# Patient Record
Sex: Female | Born: 1955 | Race: Black or African American | Hispanic: No | Smoking: Never smoker
Health system: Southern US, Community
[De-identification: ages and names within clinical notes are randomized; demographics above are authoritative.]

## PROBLEM LIST (undated history)

## (undated) HISTORY — PX: CHOLECYSTECTOMY: SHX55

## (undated) HISTORY — PX: ANTERIOR CERVICAL DISCECTOMY: SHX1160

---

## 1997-09-05 ENCOUNTER — Emergency Department (HOSPITAL_COMMUNITY): Admission: EM | Admit: 1997-09-05 | Discharge: 1997-09-05 | Payer: Self-pay | Admitting: Internal Medicine

## 1998-12-16 ENCOUNTER — Emergency Department (HOSPITAL_COMMUNITY): Admission: EM | Admit: 1998-12-16 | Discharge: 1998-12-17 | Payer: Self-pay | Admitting: *Deleted

## 1999-10-25 ENCOUNTER — Ambulatory Visit (HOSPITAL_COMMUNITY): Admission: RE | Admit: 1999-10-25 | Discharge: 1999-10-25 | Payer: Self-pay | Admitting: Family Medicine

## 1999-10-25 ENCOUNTER — Encounter: Payer: Self-pay | Admitting: Family Medicine

## 1999-11-12 ENCOUNTER — Encounter: Payer: Self-pay | Admitting: Family Medicine

## 1999-11-12 ENCOUNTER — Encounter: Admission: RE | Admit: 1999-11-12 | Discharge: 1999-11-12 | Payer: Self-pay | Admitting: Family Medicine

## 2000-01-03 ENCOUNTER — Inpatient Hospital Stay (HOSPITAL_COMMUNITY): Admission: RE | Admit: 2000-01-03 | Discharge: 2000-01-05 | Payer: Self-pay | Admitting: Neurosurgery

## 2000-02-08 ENCOUNTER — Encounter: Admission: RE | Admit: 2000-02-08 | Discharge: 2000-02-08 | Payer: Self-pay | Admitting: Neurosurgery

## 2000-03-20 ENCOUNTER — Encounter: Payer: Self-pay | Admitting: *Deleted

## 2000-03-20 ENCOUNTER — Encounter: Admission: RE | Admit: 2000-03-20 | Discharge: 2000-03-20 | Payer: Self-pay | Admitting: *Deleted

## 2000-04-11 ENCOUNTER — Encounter: Admission: RE | Admit: 2000-04-11 | Discharge: 2000-04-11 | Payer: Self-pay | Admitting: Neurosurgery

## 2001-03-21 ENCOUNTER — Encounter: Payer: Self-pay | Admitting: *Deleted

## 2001-03-21 ENCOUNTER — Encounter: Admission: RE | Admit: 2001-03-21 | Discharge: 2001-03-21 | Payer: Self-pay | Admitting: *Deleted

## 2001-09-20 ENCOUNTER — Other Ambulatory Visit: Admission: RE | Admit: 2001-09-20 | Discharge: 2001-09-20 | Payer: Self-pay | Admitting: *Deleted

## 2001-10-05 ENCOUNTER — Ambulatory Visit (HOSPITAL_COMMUNITY): Admission: RE | Admit: 2001-10-05 | Discharge: 2001-10-05 | Payer: Self-pay | Admitting: Gastroenterology

## 2002-01-07 ENCOUNTER — Encounter: Payer: Self-pay | Admitting: General Surgery

## 2002-01-08 ENCOUNTER — Encounter (INDEPENDENT_AMBULATORY_CARE_PROVIDER_SITE_OTHER): Payer: Self-pay | Admitting: Specialist

## 2002-01-08 ENCOUNTER — Ambulatory Visit (HOSPITAL_COMMUNITY): Admission: RE | Admit: 2002-01-08 | Discharge: 2002-01-08 | Payer: Self-pay | Admitting: General Surgery

## 2002-03-25 ENCOUNTER — Encounter: Admission: RE | Admit: 2002-03-25 | Discharge: 2002-03-25 | Payer: Self-pay | Admitting: *Deleted

## 2002-03-25 ENCOUNTER — Encounter: Payer: Self-pay | Admitting: *Deleted

## 2002-12-30 ENCOUNTER — Other Ambulatory Visit: Admission: RE | Admit: 2002-12-30 | Discharge: 2002-12-30 | Payer: Self-pay | Admitting: Family Medicine

## 2003-01-06 ENCOUNTER — Ambulatory Visit (HOSPITAL_COMMUNITY): Admission: RE | Admit: 2003-01-06 | Discharge: 2003-01-06 | Payer: Self-pay | Admitting: Family Medicine

## 2004-03-14 ENCOUNTER — Other Ambulatory Visit: Admission: RE | Admit: 2004-03-14 | Discharge: 2004-03-14 | Payer: Self-pay | Admitting: Family Medicine

## 2004-03-15 ENCOUNTER — Ambulatory Visit (HOSPITAL_COMMUNITY): Admission: RE | Admit: 2004-03-15 | Discharge: 2004-03-15 | Payer: Self-pay | Admitting: Family Medicine

## 2004-03-23 ENCOUNTER — Encounter (HOSPITAL_COMMUNITY): Admission: RE | Admit: 2004-03-23 | Discharge: 2004-06-21 | Payer: Self-pay | Admitting: Family Medicine

## 2005-01-17 ENCOUNTER — Emergency Department (HOSPITAL_COMMUNITY): Admission: EM | Admit: 2005-01-17 | Discharge: 2005-01-17 | Payer: Self-pay | Admitting: Emergency Medicine

## 2007-02-21 ENCOUNTER — Other Ambulatory Visit: Admission: RE | Admit: 2007-02-21 | Discharge: 2007-02-21 | Payer: Self-pay | Admitting: Family Medicine

## 2009-01-21 ENCOUNTER — Other Ambulatory Visit: Admission: RE | Admit: 2009-01-21 | Discharge: 2009-01-21 | Payer: Self-pay | Admitting: Obstetrics and Gynecology

## 2009-02-09 ENCOUNTER — Encounter: Admission: RE | Admit: 2009-02-09 | Discharge: 2009-02-09 | Payer: Self-pay | Admitting: Obstetrics and Gynecology

## 2010-02-06 ENCOUNTER — Encounter: Payer: Self-pay | Admitting: Family Medicine

## 2010-02-07 ENCOUNTER — Encounter: Payer: Self-pay | Admitting: Endocrinology

## 2010-02-10 ENCOUNTER — Encounter
Admission: RE | Admit: 2010-02-10 | Discharge: 2010-02-10 | Payer: Self-pay | Source: Home / Self Care | Attending: Family Medicine | Admitting: Family Medicine

## 2010-06-04 NOTE — Op Note (Signed)
[place]  Patient:    Dorothy Davis, Dorothy Davis                   MRN: [id number] Proc. Date: 01/03/00 Adm. Date:  [id number] Attending:  Tressie Stalker D                           Operative Report  PREOPERATIVE DIAGNOSES:  C5-6 herniated nucleus pulposus, degenerative disk disease, spinal stenosis, cervical radiculopathy.  POSTOPERATIVE DIAGNOSES:  C5-6 herniated nucleus pulposus, degenerative disk disease, spinal stenosis, cervical radiculopathy.  PROCEDURE:  C5-6 extensive anterior cervical diskectomy, interbody iliac crest allograft arthrodesis, anterior cervical plating C5 and C6 using Codman titanium plate and screws.  SURGEON:  Cristi Loron, M.D.  ASSISTANT:  Stefani Dama, M.D.  ANESTHESIA:  General endotracheal.  ESTIMATED BLOOD LOSS:  100 cc.  SPECIMENS:  None.  DRAINS:  None.  COMPLICATIONS:  None.  BRIEF HISTORY:  The patient is a 55 year old black female who suffered from neck and left arm pain.  She failed medical management and was worked up as an outpatient with a cervical MRI that demonstrated a herniated nucleus pulposus at C5-6 on the left.  The patients signs and symptoms and physical exam were consistent with a left C6 radiculopathy.  She therefore weighed the risks, benefits, and alternatives of surgery and decided to proceed with a C5-6 anterior cervical diskectomy and fusion and plating.  DESCRIPTION OF PROCEDURE:  The patient was brought to the operating room by the anesthesia team.  General endotracheal anesthesia was induced.  The patient remained in a supine position.  A roll was placed under her shoulder to place her neck in slight extension.  Her anterior cervical region was then prepared with Betadine scrub and Betadine solution and sterile drapes were applied.  I then injected the area to be incised with Marcaine with epinephrine solution and then used a scalpel to make a transverse incision  in the patients left neck.  I used Metzenbaum scissors to dissect down to the platysma muscle.  I divided it along the direction of the skin incision and then dissected with the scissors medial to the sternocleidomastoid muscle, jugular vein, and carotid artery.  I carefully dissected down toward the anterior cervical spine, identified the esophagus, and carefully retracted it medially.  I cleared the soft tissue from the anterior cervical spine with Kitner swabs.  I inserted a bent spinal needle into the exposed interspace and obtained an intraoperative radiograph, which demonstrated the needle was in C5-6.  I then used electrocautery to detach the medial border of the longus colli muscle bilaterally from C5-6 interspace and inserted the Caspar self-retaining retractor for exposure.  I then incised the C5-6 intervertebral disk with a 15 blade scalpel and performed a partial diskectomy with the pituitary forceps and the Carlin curettes.  I then used the high-speed drill to decorticate the vertebral end plates at C5 and C6.  I drilled away the remainder of the intervertebral disk at C5-6.  I thinned out the posterior longitudinal ligament with the drill and incised it with the arachnoid knife. I removed the ligament with the Kerrison punch, undercutting the vertebral end plates at C5 and C6, decompressing the thecal sac.  On the left side, there was a herniated disk, which I removed with the pituitary forceps and the Kerrison punches, decompressing the C6 nerve root.  I performed a foraminotomy about the bilateral C6 nerve root.  At this point, I had a good decompression of the thecal sac and bilateral C6 nerve root.  I now turned my attention to the arthrodesis.  I obtained an iliac crest tricortical allograft bone graft and fashioned it to these approximate dimensions:  7 mm in height, 1 cm in depth.  I inserted it in the distracted C5-6 interspace (I forgot to mention, I placed  distraction screws in at C5-6 and distracted the interspace).  I then removed the distraction screws.  There was a good, snug fit of the bone graft at C5-6.  Having completed the arthrodesis, I now turned my attention to the anterior spinal instrumentation.  I obtained the appropriate length Codman anterior cervical plate, laid it along the anterior aspect of C5-6, drilled two holes at C5, two holes at C6, tapped these holes, and secured the plate to the vertebral bodies with 12 mm screws.  I obtained the intraoperative radiograph demonstrating good position of the plate, screws, and interbody graft.  I then secured the screws to the plate with the cam tightener.  I then copiously irrigated the wound with bacitracin solution and removed the solution and then achieved stringent hemostasis.  I then removed the Caspar self-retaining retractor, inspected the esophagus for any damage, there was none, and then reapproximated the patients platysma muscle with interrupted 0 Vicryl suture, the subcutaneous tissue with interrupted 3-0 Vicryl suture, and skin with Steri-Strips and benzoin.  The wound was then coated with bacitracin ointment and a sterile dressing was applied and the drapes were removed, and the patient was subsequently extubated by the anesthesia team and transported to the postanesthesia care unit in stable condition.  All sponge, instrument, and needle counts were correct at the end of this case. DD:  01/03/00 TD:  01/04/00 Job: [id number]

## 2010-06-04 NOTE — Op Note (Signed)
Dorothy Davis, Dorothy Davis                      ACCOUNT NO.:  [id number]   MEDICAL RECORD NO.:  [id number]                   PATIENT TYPE:  AMB   LOCATION:  DAY                                  FACILITY:  [facility]   PHYSICIAN:  Adolph Pollack, M.D.            DATE OF BIRTH:  1955/07/03   DATE OF PROCEDURE:  01/08/2002  DATE OF DISCHARGE:                                 OPERATIVE REPORT   PREOPERATIVE DIAGNOSIS:  Internal and external hemorrhoids.  o   POSTOPERATIVE DIAGNOSIS:  Internal and external hemorrhoids.   PROCEDURE:  Internal and external hemorrhoidectomies with anoscopy.   SURGEON:  Adolph Pollack, M.D.   ANESTHESIA:  General.   INDICATIONS:  The patient is a 55 year old female who had been bothered by  pain and bleeding from hemorrhoids for many years. We tried rubber band  ligation in the office but she continues to have symptoms. She now presents  for elective hemorrhoidectomy. The procedure and the risks were explained to  her preoperatively.   DESCRIPTION OF PROCEDURE:  She was brought to the operating room and placed  supine on the operating table and a general anesthetic was administered. She  was then placed in the lithotomy position. The perianal area was sterilely  prepped and draped.   A digital rectal exam was done and no anal masses were noted. The anoscope  was inserted and a left lateral hemorrhoid with internal and external was  noted. The area for the incision was marked with a Bovie. The vascular  pedicle was then ligated with a 2-0 chromic suture and 0.0.5% Marcaine with  epinephrine was infiltrated in the subdermal tissues.   The hemorrhoid was excised sharply on both its internal and external  components, with care taken not to injure the underlying external or  internal sphincter muscles. It was handed off the field and marked left  lateral hemorrhoid. The bleeding was controlled with cautery. Then the  mucosa and the skin were  reapproximated with a running locking 3-0 chromic  suture.   Next a right posterolateral hemorrhoid was approached. Again there were  internal and external components like there was to the left lateral  hemorrhoid. The area of excision was marked with cautery and then the  vascular pedicle was ligated with 3-0 chromic suture. Local anesthetic was  infiltrated in the subdermal region and the hemorrhoid was then excised  sharply, again, taking care not to injure external or internal sphincter  muscle. The hemorrhoid was labeled right posterior.  The bleeding was  controlled with cautery. The mucosa and skin were closed with running  locking 3-0 chromic suture.   Posteriorly between these other two, there was another accessory  hemorrhoidal cushion. I ligated the vascular pedicle, anesthetized it,  marked the area of incision with the Bovie and excised it sharply and closed  the defect with running locking 3-0 chromic suture. There was a small,  moderate sized internal  hemorrhoid only in the right anterolateral position,  and I just ligated this with a chromic suture.   I inspected the area and noted that hemostasis was adequate. She did not  appear to be too tight with respect to anal stenosis. I inserted a piece of  Gelfoam into the anus and covered it with a bulky dressing.   The patient tolerated the procedure well without any apparent complications.  She was extubated and transferred to the recovery room in satisfactory  condition. I have given her detailed postoperative instructions as well as  Tylox for pain and she will follow up with me in three weeks in the office.                                               Adolph Pollack, M.D.    Kari Baars  D:  01/08/2002  T:  01/09/2002  Job:  [id number]   cc:   Anselmo Rod, M.D.  [address]  [place]  [place] [address]  Fax: [phone]   Elana Alm. Nicholos Johns, M.D.  [address]  [place]  [place] [address]   Fax: [phone]

## 2010-06-04 NOTE — Discharge Summary (Signed)
[place]. [facility]  Patient:    Dorothy Davis, Dorothy Davis                   MRN: [id number] Adm. Date:  [id number] Disc. Date: [id number] Attending:  Tressie Stalker D                           Discharge Summary  For full details of this admission please refer to typed history and physical.  HISTORY OF PRESENT ILLNESS:  The patient is a 55 year old black female who suffers from neck and left arm.  She failed medical management and was worked up as an outpatient with a cervical MRI that demonstrated herniated nucleus pulposus C5-6 on the left.  The patients signs and symptoms and physical exam were consistent with a left C6 radiculopathy.  She therefore weighed the risks, benefits, alternatives of surgery and decided to proceed with a C5-6 anterior cervical diskectomy, fusion, and plating.  PAST MEDICAL HISTORY/PAST SURGICAL HISTORY/MEDICATIONS PRIOR TO ADMISSION/DRUG ALLERGIES/FAMILY AND MEDICAL HISTORY/SOCIAL HISTORY/ADMISSION PHYSICAL EXAMINATION/IMAGING STUDIES/ASSESSMENT AND PLAN/ETC:  Please refer to typed history and physical.  HOSPITAL COURSE:  I performed a C5-6 extensive anterior cervical diskectomy, interbody iliac crest allograft arthrodesis, anterior cervical plating C5-6 using Codman titanium plate and screws on January 03, 2000.  The surgery went well without complications (for full details of this operation please refer to typed operative note).  POSTOPERATIVE COURSE:  The patients postoperative course was remarkable only for a fever to 100.8 on postop day #1.  She was therefore observed to postop day #2 and her maximum temperature was 100.1, there were no signs of infection, wound was healing well, she was feeling well, she was eating well, ambulating well, and she had normal motor strength and she was requesting discharge home.  I therefore discharged her home on January 05, 2000.  DISCHARGE MEDICATIONS: 1. Demerol 50 mg #60 one to two p.o.  q.4h. p.r.n. for pain, no refills. 2. Valium 5 mg #40 one p.o. q.6h. p.r.n. for muscle spasms, one refill.  FINAL DIAGNOSES: 1. C5-6 herniated nucleus pulposus. 2. Degenerative disk disease. 3. Spinal stenosis. 4. Cervical radiculopathy.  PROCEDURE PERFORMED:  C5-6 extensive anterior cervical diskectomy, interbody iliac crest allograft arthrodesis, anterior cervical plating of C5-6 using Codman titanium plate and screws. DD:  01/05/00 TD:  01/06/00 Job: [id number]

## 2010-06-04 NOTE — Op Note (Signed)
Dorothy Davis, Dorothy Davis                      ACCOUNT NO.:  [id number]   MEDICAL RECORD NO.:  [id number]                   PATIENT TYPE:  AMB   LOCATION:  ENDO                                 FACILITY:  [facility]   PHYSICIAN:  Charna Elizabeth, M.D.                   DATE OF BIRTH:  Apr 09, 1955   DATE OF PROCEDURE:  10/05/2001  DATE OF DISCHARGE:                                 OPERATIVE REPORT   PROCEDURE:  Colonoscopy.   ENDOSCOPIST:  Charna Elizabeth, M.D.   INSTRUMENT USED:  Olympus video colonoscope.   INDICATION FOR PROCEDURE:  A 55 year old African-American female with a one-  year history of rectal bleeding and a family history of breast cancer in her  sister.  There is no known history of colon cancer.  The patient has not had  a previous colonoscopy.   PREPROCEDURE PREPARATION:  Informed consent was procured from the patient.  The patient was fasted for eight hours prior to the procedure and prepped  with a bottle of magnesium citrate and a gallon of NuLytely the night prior  to the procedure.   PREPROCEDURE PHYSICAL:  VITAL SIGNS:  The patient had stable vital signs.  NECK:  Supple.  CHEST:  Clear to auscultation.  S1, S2 regular.  ABDOMEN:  Soft with normal bowel sounds.   DESCRIPTION OF PROCEDURE:  The patient was placed in the left lateral  decubitus position and sedated with 70 mg of Demerol and 8 mg of Versed  intravenously.  Once the patient was adequately sedate and maintained on low-  flow oxygen and continuous cardiac monitoring, the Olympus video colonoscope  was advanced from the rectum to the cecum without difficulty.  The patient  had large external hemorrhoids, which were bleeding.  No other abnormalities  were noted.  The appendiceal orifice and the ileocecal valve were clearly  visualized and photographed.   IMPRESSION:  Normal colonoscopy up to the cecum except for large bleeding  prolapsed internal hemorrhoids.   RECOMMENDATIONS:  1. A surgical evaluation  has been set up for the patient with Adolph Pollack, M.D., on 10/09/01.  2. A high-fiber diet and liberal fluid intake have been advocated.  3.     Repeat colorectal cancer screening is recommended in the next 10 years     unless the patient develops any abnormal symptoms in the interim.  4. Outpatient follow-up in the next two weeks or earlier if need be.                                               Charna Elizabeth, M.D.    JM/MEDQ  D:  10/05/2001  T:  10/08/2001  Job:  [id number]   cc:   Molly Maduro A. Nicholos Johns, M.D.   Tawanna Cooler  Laurie Panda, M.D.  Fax: [phone]

## 2010-06-04 NOTE — H&P (Signed)
[place]. [facility]  Patient:    Dorothy Davis, Dorothy Davis                   MRN: [id number] Proc. Date: 01/03/00 Adm. Date:  [id number] Attending:  Tressie Stalker D                         History and Physical  CHIEF COMPLAINT: Neck pain and left arm pain.  HISTORY OF PRESENT ILLNESS: The patient is a 55 year old black female who began having neck and left arm pain in September 2001.  She was treated with medications and she failed medical management.  She was worked up with a cervical MRI that demonstrates a herniated disk at C5-6.  The patient S1 signs and symptoms and physical examination were consistent with a C6 radiculopathy and she therefore weighed the risks and benefits and alternatives of surgery and decided to proceed with anterior cervical diskectomy with fusion and plating.  PAST MEDICAL HISTORY: Remote history of cholecystitis.  PAST SURGICAL HISTORY: Cholecystectomy.  MEDICATIONS: Over-the-counter medications.  ALLERGIES:  1. PENICILLIN.  2. SULFA.  3. CODEINE.  4. ADVIL.  FAMILY HISTORY: The patients mother died of complications from diabetes mellitus at the age of 74.  The patients father died at the age of 97 with throat cancer, myocardial infarction, and alcohol abuse.  SOCIAL HISTORY: The patient is married and has two children.  [place]  She is employed at [employer] as a Occupational psychologist.  She has smoked 1-1/2 packs of cigarettes per day x approximately 26 years - I have highly advised her to quit.  She denies ethanol or drug use.  REVIEW OF SYSTEMS: Negative except as above.  PHYSICAL EXAMINATION:  GENERAL: The patient is a pleasant, moderately obese 55 year old black female, complaining of neck and left arm pain.  HEENT: Normal examination.  NECK: Supple.  There are no masses, deformities, tracheal deviation, jugular venous distention, or carotid bruits.  She has mildly  diminished cervical range of motion.  Spurling test is positive on the left, negative on the right.  Lhermittes sign was not present.  The thorax is symmetric.  LUNGS: Clear to auscultation.  HEART: Regular rate and rhythm.  ABDOMEN: Obese, soft, nontender.  EXTREMITIES: No obvious deformities.  BACK: No point tenderness or deformities.  Obese.  Straight leg raising and Faber testing negative bilaterally.  NEUROLOGIC: The patient is alert and oriented x 3.  Cranial nerves 2-12 are grossly intact bilaterally.  Vision and hearing are grossly normal bilaterally.  Motor strength is 5/5 and the bilateral deltoids, biceps, triceps, hand grip, wrist extensor, interosseous, psoas, quadriceps, gastrocnemius, and extensor hallucis longus.  Cerebellar examination is intact to rapid alternating movements of the upper extremities bilaterally.  Sensory examination is normal to light touch in all tested dermatomes bilaterally. Deep tendon reflexes are 2/4 in the bilateral biceps, triceps, brachial radialis, and 1/4 in the bilateral quadriceps, gastrocnemius.  She has bilateral flexor and plantar reflexes.  No ankle clonus.  IMAGING STUDIES: The patient had a cervical MRI performed at [facility] at November 12, 1999 that demonstrates a straight cervical spine and left-sided herniated nucleus pulposus at C5-6.  ASSESSMENT/PLAN: C5-6 herniated nucleus pulposus with spinal stenosis, degenerative disk disease, and cervical radiculopathy.  I have reviewed the patients MRI scan with her and pointed out the abnormalities.  Her signs and symptoms and physical examination are consistent with a C6 radiculopathy and she  has failed medical management.  I therefore discussed surgical options including a C5-6 anterior cervical diskectomy with fusion and plating.  I have shown her surgical models and discussed the risks of surgery extensively.  The patient has weighed the risks and benefits and alternatives of surgery  and would like to proceed with C5-6 anterior cervical diskectomy with fusion and plating on January 03, 2000. DD:  01/03/00 TD:  01/04/00 Job: [id number]

## 2011-01-25 ENCOUNTER — Other Ambulatory Visit: Payer: Self-pay | Admitting: Family Medicine

## 2011-01-25 DIAGNOSIS — Z1231 Encounter for screening mammogram for malignant neoplasm of breast: Secondary | ICD-10-CM

## 2011-02-16 ENCOUNTER — Ambulatory Visit
Admission: RE | Admit: 2011-02-16 | Discharge: 2011-02-16 | Disposition: A | Discharge status: Home / Self Care | Payer: BC Managed Care – PPO | Source: Ambulatory Visit | Attending: Family Medicine | Admitting: Family Medicine

## 2011-02-16 DIAGNOSIS — Z1231 Encounter for screening mammogram for malignant neoplasm of breast: Secondary | ICD-10-CM

## 2012-01-30 ENCOUNTER — Other Ambulatory Visit: Payer: Self-pay | Admitting: Family Medicine

## 2012-01-30 DIAGNOSIS — Z1231 Encounter for screening mammogram for malignant neoplasm of breast: Secondary | ICD-10-CM

## 2012-02-20 ENCOUNTER — Ambulatory Visit
Admission: RE | Admit: 2012-02-20 | Discharge: 2012-02-20 | Disposition: A | Discharge status: Home / Self Care | Payer: BC Managed Care – PPO | Source: Ambulatory Visit | Attending: Family Medicine | Admitting: Family Medicine

## 2012-02-20 DIAGNOSIS — Z1231 Encounter for screening mammogram for malignant neoplasm of breast: Secondary | ICD-10-CM

## 2013-03-12 ENCOUNTER — Other Ambulatory Visit: Payer: Self-pay

## 2013-03-12 DIAGNOSIS — Z1231 Encounter for screening mammogram for malignant neoplasm of breast: Secondary | ICD-10-CM

## 2013-03-15 ENCOUNTER — Ambulatory Visit: Payer: BC Managed Care – PPO

## 2013-03-29 ENCOUNTER — Ambulatory Visit
Admission: RE | Admit: 2013-03-29 | Discharge: 2013-03-29 | Disposition: A | Discharge status: Home / Self Care | Payer: BC Managed Care – PPO | Source: Ambulatory Visit

## 2013-03-29 DIAGNOSIS — Z1231 Encounter for screening mammogram for malignant neoplasm of breast: Secondary | ICD-10-CM

## 2014-03-18 ENCOUNTER — Other Ambulatory Visit: Payer: Self-pay

## 2014-03-18 DIAGNOSIS — Z1231 Encounter for screening mammogram for malignant neoplasm of breast: Secondary | ICD-10-CM

## 2014-04-01 ENCOUNTER — Ambulatory Visit
Admission: RE | Admit: 2014-04-01 | Discharge: 2014-04-01 | Disposition: A | Discharge status: Home / Self Care | Payer: BLUE CROSS/BLUE SHIELD | Source: Ambulatory Visit

## 2014-04-01 DIAGNOSIS — Z1231 Encounter for screening mammogram for malignant neoplasm of breast: Secondary | ICD-10-CM

## 2020-05-02 ENCOUNTER — Other Ambulatory Visit: Payer: Self-pay

## 2020-05-02 ENCOUNTER — Encounter: Payer: Self-pay | Admitting: Emergency Medicine

## 2020-05-02 ENCOUNTER — Emergency Department (INDEPENDENT_AMBULATORY_CARE_PROVIDER_SITE_OTHER)
Admission: EM | Admit: 2020-05-02 | Discharge: 2020-05-02 | Disposition: A | Discharge status: Home / Self Care | Payer: 59 | Source: Home / Self Care

## 2020-05-02 DIAGNOSIS — R21 Rash and other nonspecific skin eruption: Secondary | ICD-10-CM

## 2020-05-02 DIAGNOSIS — B86 Scabies: Secondary | ICD-10-CM | POA: Diagnosis not present

## 2020-05-02 MED ORDER — PERMETHRIN 5 % EX CREA: TOPICAL_CREAM | CUTANEOUS | 0 refills | Status: DC

## 2020-05-02 NOTE — ED Provider Notes (Signed)
[facility]   [id number] 05/02/20 Arrival Time: 1353  CC: RASH  SUBJECTIVE:  Dorothy Davis is a 65 y.o. female who presents with a skin complaint that began 2 weeks ago. Reports scabies exposure to her great grandson. Denies precipitating event or trauma. Denies changes in soaps, detergents, close contacts with similar rash, known trigger or environmental trigger, allergy. Denies medications change or starting a new medication recently.  Localizes the rash to hands, arms, neck and back. Describes it as red, raised, itchy. Has tried neem oil and benadryl without relief. There are no aggravating or alleviating factors. Denies similar symptoms in the past. Denies fever, chills, nausea, vomiting, swelling, discharge, oral lesions, SOB, chest pain, abdominal pain, changes in bowel or bladder function.    ROS: As per HPI.  All other pertinent ROS negative.     History reviewed. No pertinent past medical history. Past Surgical History:  Procedure Laterality Date  . ANTERIOR CERVICAL DISCECTOMY    . CHOLECYSTECTOMY     Allergies  Allergen Reactions  . Codeine Hives  . Penicillins Hives  . Sulfa Antibiotics   . Aspirin Rash   No current facility-administered medications on file prior to encounter.   No current outpatient medications on file prior to encounter.   Social History   Socioeconomic History  . Marital status: Divorced    Spouse name: Not on file  . Number of children: Not on file  . Years of education: Not on file  . Highest education level: Not on file  Occupational History  . Not on file  Tobacco Use  . Smoking status: Never Smoker  . Smokeless tobacco: Never Used  Substance and Sexual Activity  . Alcohol use: Not Currently  . Drug use: Never  . Sexual activity: Not on file  Other Topics Concern  . Not on file  Social History Narrative  . Not on file   Social Determinants of Health   Financial Resource Strain: Not on file  Food Insecurity: Not  on file  Transportation Needs: Not on file  Physical Activity: Not on file  Stress: Not on file  Social Connections: Not on file  Intimate Partner Violence: Not on file   Family History  Problem Relation Age of Onset  . Diabetes Mother   . Diabetes Father   . Throat cancer Father     OBJECTIVE: Vitals:   05/02/20 1408 05/02/20 1410  BP: (!) 147/93   Pulse: 88   Resp: 16   Temp: 98 F (36.7 C)   TempSrc: Oral   SpO2: 100%   Weight:  190 lb (86.2 kg)  Height:  5' 4.5" (1.638 m)    General appearance: alert; no distress Head: NCAT Lungs: clear to auscultation bilaterally Heart: regular rate and rhythm.  Radial pulse 2+ bilaterally Extremities: no edema Skin: warm and dry; erythematous maculopapular rash to bilateral wrists, arms, bag, between fingers, consistent with scabies Psychological: alert and cooperative; normal mood and affect  ASSESSMENT & PLAN:  1. Scabies   2. Rash     Meds ordered this encounter  Medications  . permethrin (ELIMITE) 5 % cream    Sig: Apply to affected area once    Dispense:  60 g    Refill:  0    Order Specific Question:   Supervising Provider    Answer:   Merrilee Jansky [[id number]]   Permethrin prescribed Apply to skin from the neck down Sleep in the cream for 8-12 hours, then shower Avoid  hot showers/ baths Moisturize skin daily  Follow up with PCP if symptoms persists Return or go to the ER if you have any new or worsening symptoms such as fever, chills, nausea, vomiting, redness, swelling, discharge, if symptoms do not improve with medications  Reviewed expectations re: course of current medical issues. Questions answered. Outlined signs and symptoms indicating need for more acute intervention. Patient verbalized understanding. After Visit Summary given.   Moshe Cipro, NP 05/03/20 [phone]

## 2020-05-02 NOTE — Discharge Instructions (Addendum)
I have sent in permethrin cream. Apply liberally to the skin from the neck to the soles of the feet. Sleep with the cream on the skin, then wash off in the morning.  If rash returns, may repeat application in 2 weeks.  Follow up with this office or with primary care if symptoms are persisting.  Follow up in the ER for high fever, trouble swallowing, trouble breathing, other concerning symptoms.

## 2020-05-02 NOTE — ED Triage Notes (Signed)
Pt's great grandson was diagnosed w/ scabies w/in the last few weeks  Pt presents w/ 2 weeks of itching to wrists & back - red areas noted  No COVID vaccine

## 2020-05-08 ENCOUNTER — Other Ambulatory Visit: Payer: Self-pay | Admitting: Emergency Medicine

## 2020-05-08 ENCOUNTER — Telehealth: Payer: Self-pay | Admitting: Emergency Medicine

## 2020-05-08 MED ORDER — PERMETHRIN 5 % EX CREA: TOPICAL_CREAM | CUTANEOUS | 0 refills | Status: DC

## 2020-05-08 NOTE — Telephone Encounter (Signed)
permethrin (ELIMITE) 5 % cream     Sig: Apply to affected area once    Dispense:  60 g    Refill:  0

## 2020-05-08 NOTE — Telephone Encounter (Signed)
Voice mail from Dorothy Davis requesting a refill of permethrin cream. Pt was treated on 06/01/20 for scabies. Message sent to Dr Cathren Harsh

## 2020-05-08 NOTE — Telephone Encounter (Signed)
Return call to Dorothy Davis to confirm script had been sent to pharmacy. Pt confirmed she had already received text from pharmacy & will pick up in am

## 2020-05-30 ENCOUNTER — Other Ambulatory Visit: Payer: Self-pay

## 2020-05-30 ENCOUNTER — Encounter: Payer: Self-pay | Admitting: Emergency Medicine

## 2020-05-30 ENCOUNTER — Emergency Department
Admission: EM | Admit: 2020-05-30 | Discharge: 2020-05-30 | Disposition: A | Discharge status: Home / Self Care | Payer: 59 | Source: Home / Self Care

## 2020-05-30 DIAGNOSIS — L282 Other prurigo: Secondary | ICD-10-CM | POA: Diagnosis not present

## 2020-05-30 DIAGNOSIS — Z207 Contact with and (suspected) exposure to pediculosis, acariasis and other infestations: Secondary | ICD-10-CM

## 2020-05-30 DIAGNOSIS — Z2089 Contact with and (suspected) exposure to other communicable diseases: Secondary | ICD-10-CM

## 2020-05-30 MED ORDER — PERMETHRIN 5 % EX CREA: TOPICAL_CREAM | CUTANEOUS | 1 refills | Status: AC

## 2020-05-30 NOTE — ED Triage Notes (Signed)
Recheck for scabies - treated 05/02/20 Pt states she has noted red marks on legs  Pt has treated home - scabies was contained to one bedroom

## 2020-05-30 NOTE — ED Provider Notes (Signed)
[facility]    CSN: [id number] Arrival date & time: 05/30/20  1052      History   Chief Complaint Chief Complaint  Patient presents with  . Follow-up    HPI Dorothy Davis is a 65 y.o. female.   HPI Pleasant 65 year old female presents with follow-up for scabies.  Patient reports was initially treated on 05/02/2020 here with permethrin (Elimite 5%), patient requests another prescription for permethrin as rash is not completely resolved reports pruritic rash with scabies of left torso, bilateral breast, and lower back.  She was sent second prescription of permethrin (Elimite 5%) on 05/30/2020.  She reports her insurance company required her to have reevaluation in order to pay for medication again.  History reviewed. No pertinent past medical history.  There are no problems to display for this patient.   Past Surgical History:  Procedure Laterality Date  . ANTERIOR CERVICAL DISCECTOMY    . CHOLECYSTECTOMY      OB History   No obstetric history on file.      Home Medications    Prior to Admission medications   Medication Sig Start Date End Date Taking? Authorizing Provider  permethrin (ELIMITE) 5 % cream Apply to affected area once, leave on for 8 to 14 hours then rinse completely, may repeat in 7 to 10 days if live mites are still present. 05/30/20  Yes Trevor Iha, FNP    Family History Family History  Problem Relation Age of Onset  . Diabetes Mother   . Diabetes Father   . Throat cancer Father     Social History Social History   Tobacco Use  . Smoking status: Never Smoker  . Smokeless tobacco: Never Used  Substance Use Topics  . Alcohol use: Not Currently  . Drug use: Never     Allergies   Codeine, Penicillins, Sulfa antibiotics, and Aspirin   Review of Systems Review of Systems  Constitutional: Negative.   HENT: Negative.   Eyes: Negative.   Respiratory: Negative.   Cardiovascular: Negative.   Gastrointestinal: Negative.    Genitourinary: Negative.   Musculoskeletal: Negative.   Skin: Positive for rash.  Neurological: Negative.      Physical Exam Triage Vital Signs ED Triage Vitals  Enc Vitals Group     BP 05/30/20 1107 114/73     Pulse Rate 05/30/20 1107 68     Resp 05/30/20 1107 15     Temp 05/30/20 1107 98.4 F (36.9 C)     Temp src --      SpO2 05/30/20 1107 100 %     Weight --      Height --      Head Circumference --      Peak Flow --      Pain Score 05/30/20 1108 0     Pain Loc --      Pain Edu? --      Excl. in GC? --    No data found.  Updated Vital Signs BP 114/73 (BP Location: Right Arm)   Pulse 68   Temp 98.4 F (36.9 C)   Resp 15   LMP 03/20/2014   SpO2 100%   Physical Exam Vitals and nursing note reviewed.  Constitutional:      General: She is not in acute distress.    Appearance: Normal appearance. She is not ill-appearing.  HENT:     Head: Normocephalic and atraumatic.     Mouth/Throat:     Mouth: Mucous membranes are moist.  Pharynx: Oropharynx is clear.  Eyes:     Extraocular Movements: Extraocular movements intact.     Conjunctiva/sclera: Conjunctivae normal.     Pupils: Pupils are equal, round, and reactive to light.  Cardiovascular:     Rate and Rhythm: Normal rate and regular rhythm.     Pulses: Normal pulses.     Heart sounds: Normal heart sounds.  Pulmonary:     Effort: Pulmonary effort is normal. No respiratory distress.     Breath sounds: Normal breath sounds. No wheezing, rhonchi or rales.  Musculoskeletal:     Cervical back: Normal range of motion and neck supple. No tenderness.  Lymphadenopathy:     Cervical: No cervical adenopathy.  Skin:    Comments: Left superior torso/lower back: Diffuse scattered maculopapular eruption noted, pruritic in nature  Neurological:     General: No focal deficit present.     Mental Status: She is alert and oriented to person, place, and time.  Psychiatric:        Mood and Affect: Mood normal.         Behavior: Behavior normal.      UC Treatments / Results  Labs (all labs ordered are listed, but only abnormal results are displayed) Labs Reviewed - No data to display  EKG   Radiology No results found.  Procedures Procedures (including critical care time)  Medications Ordered in UC Medications - No data to display  Initial Impression / Assessment and Plan / UC Course  I have reviewed the triage vital signs and the nursing notes.  Pertinent labs & imaging results that were available during my care of the patient were reviewed by me and considered in my medical decision making (see chart for details).     MDM: 1.  Scabies, 2.  Pruritic rash.  Patient discharged home, hemodynamically stable. Final Clinical Impressions(s) / UC Diagnoses   Final diagnoses:  Scabies exposure  Pruritic rash     Discharge Instructions     Advised/instructed patient to apply Elimite 5% to affected area leave cream on for 8 to 12 hours then shower completely removing cream, advised may repeat in 7-10 days, if live mites are present.    ED Prescriptions    Medication Sig Dispense Auth. Provider   permethrin (ELIMITE) 5 % cream Apply to affected area once, leave on for 8 to 14 hours then rinse completely, may repeat in 7 to 10 days if live mites are still present. 60 g Trevor Iha, FNP     PDMP not reviewed this encounter.   Trevor Iha, FNP 05/30/20 1206

## 2020-05-30 NOTE — Discharge Instructions (Addendum)
Advised/instructed patient to apply Elimite 5% to affected area leave cream on for 8 to 12 hours then shower completely removing cream, advised may repeat in 7-10 days, if live mites are present.

## 2020-10-30 ENCOUNTER — Other Ambulatory Visit: Payer: Self-pay | Admitting: Family

## 2020-10-30 DIAGNOSIS — Z1231 Encounter for screening mammogram for malignant neoplasm of breast: Secondary | ICD-10-CM

## 2020-11-04 ENCOUNTER — Other Ambulatory Visit: Payer: Self-pay

## 2020-11-04 ENCOUNTER — Ambulatory Visit
Admission: RE | Admit: 2020-11-04 | Discharge: 2020-11-04 | Disposition: A | Discharge status: Home / Self Care | Payer: Medicare Other | Source: Ambulatory Visit | Attending: Family | Admitting: Family

## 2020-11-04 DIAGNOSIS — Z1231 Encounter for screening mammogram for malignant neoplasm of breast: Secondary | ICD-10-CM

## 2021-02-03 ENCOUNTER — Other Ambulatory Visit: Payer: Self-pay | Admitting: Student

## 2021-02-03 DIAGNOSIS — E2839 Other primary ovarian failure: Secondary | ICD-10-CM

## 2021-06-22 ENCOUNTER — Ambulatory Visit
Admission: RE | Admit: 2021-06-22 | Discharge: 2021-06-22 | Disposition: A | Discharge status: Home / Self Care | Payer: Medicare Other | Source: Ambulatory Visit | Attending: Student | Admitting: Student

## 2021-06-22 DIAGNOSIS — E2839 Other primary ovarian failure: Secondary | ICD-10-CM

## 2021-10-04 ENCOUNTER — Other Ambulatory Visit: Payer: Self-pay | Admitting: Family

## 2021-10-04 DIAGNOSIS — Z1231 Encounter for screening mammogram for malignant neoplasm of breast: Secondary | ICD-10-CM

## 2021-11-05 ENCOUNTER — Ambulatory Visit
Admission: RE | Admit: 2021-11-05 | Discharge: 2021-11-05 | Disposition: A | Discharge status: Home / Self Care | Payer: Medicare Other | Source: Ambulatory Visit | Attending: Family | Admitting: Family

## 2021-11-05 DIAGNOSIS — Z1231 Encounter for screening mammogram for malignant neoplasm of breast: Secondary | ICD-10-CM

## 2022-10-05 ENCOUNTER — Other Ambulatory Visit: Payer: Self-pay | Admitting: Family

## 2022-10-05 DIAGNOSIS — Z1231 Encounter for screening mammogram for malignant neoplasm of breast: Secondary | ICD-10-CM

## 2022-11-07 ENCOUNTER — Ambulatory Visit
Admission: RE | Admit: 2022-11-07 | Discharge: 2022-11-07 | Disposition: A | Discharge status: Home / Self Care | Payer: 59 | Source: Ambulatory Visit | Attending: Family | Admitting: Family

## 2022-11-07 DIAGNOSIS — Z1231 Encounter for screening mammogram for malignant neoplasm of breast: Secondary | ICD-10-CM

## 2023-08-16 IMAGING — MG MM DIGITAL SCREENING BILAT W/ TOMO AND CAD
8 series · 8 of 24 positions shown · non-contrast
Comparison: Previous exam(s).

CLINICAL DATA: Screening.

EXAM:
DIGITAL SCREENING BILATERAL MAMMOGRAM WITH TOMOSYNTHESIS AND CAD
TECHNIQUE: Bilateral screening digital craniocaudal and mediolateral oblique
mammograms were obtained. Bilateral screening digital breast
tomosynthesis was performed. The images were evaluated with
computer-aided detection.

[L MLO synth-2D]
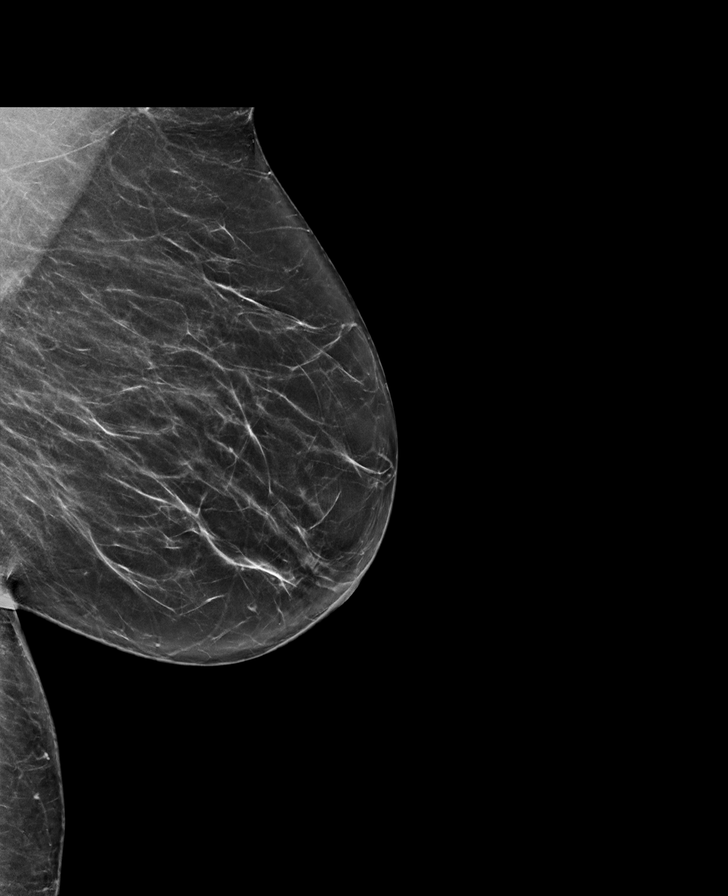

[R MLO synth-2D]
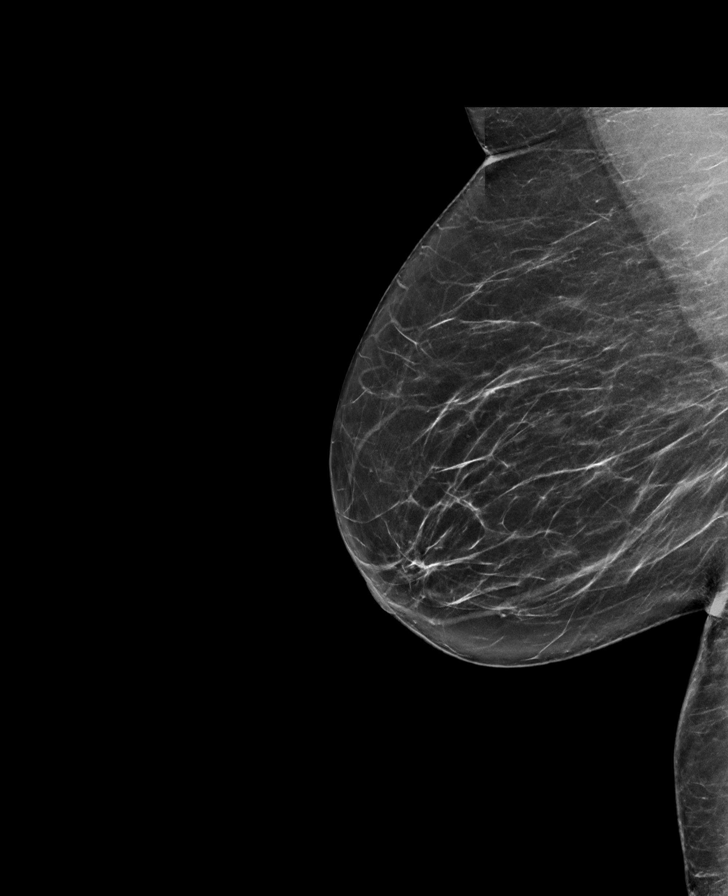

[L CC synth-2D]
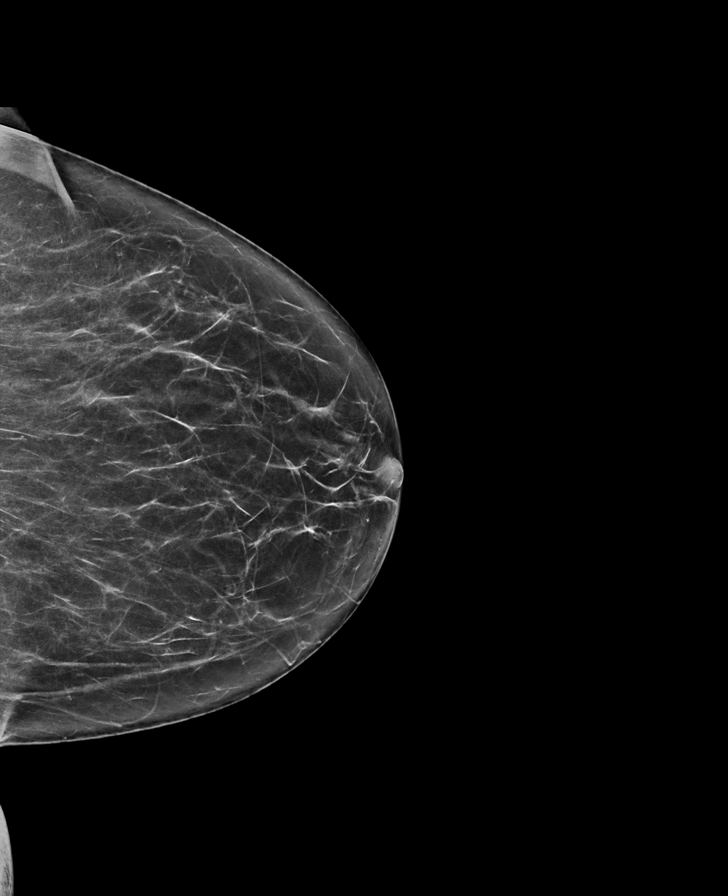

[R CC synth-2D]
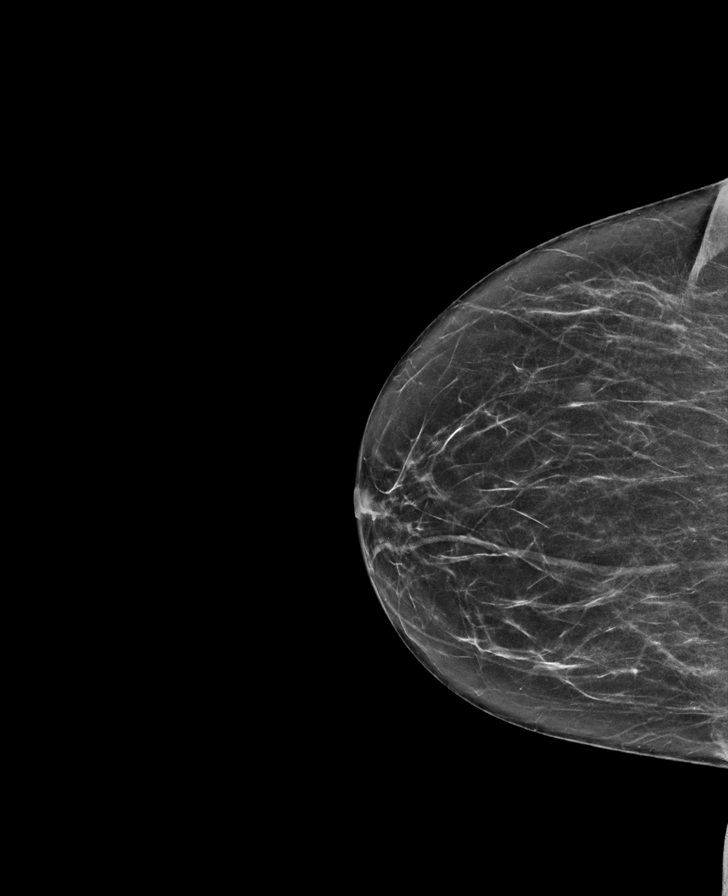

[L CC tomo · tomo slice 32/63.0]
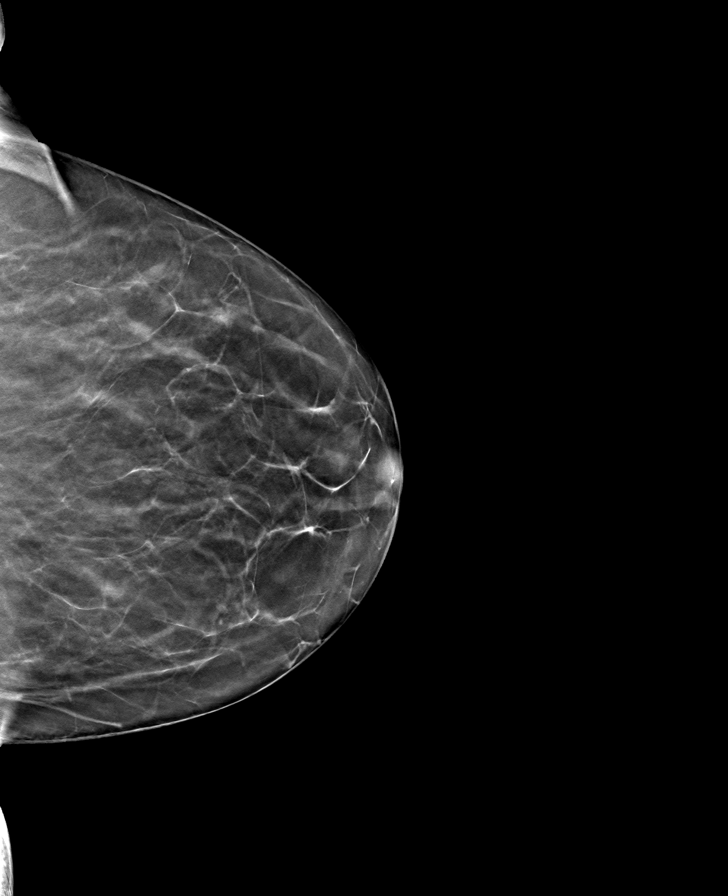

[L MLO tomo · tomo slice 34/67.0]
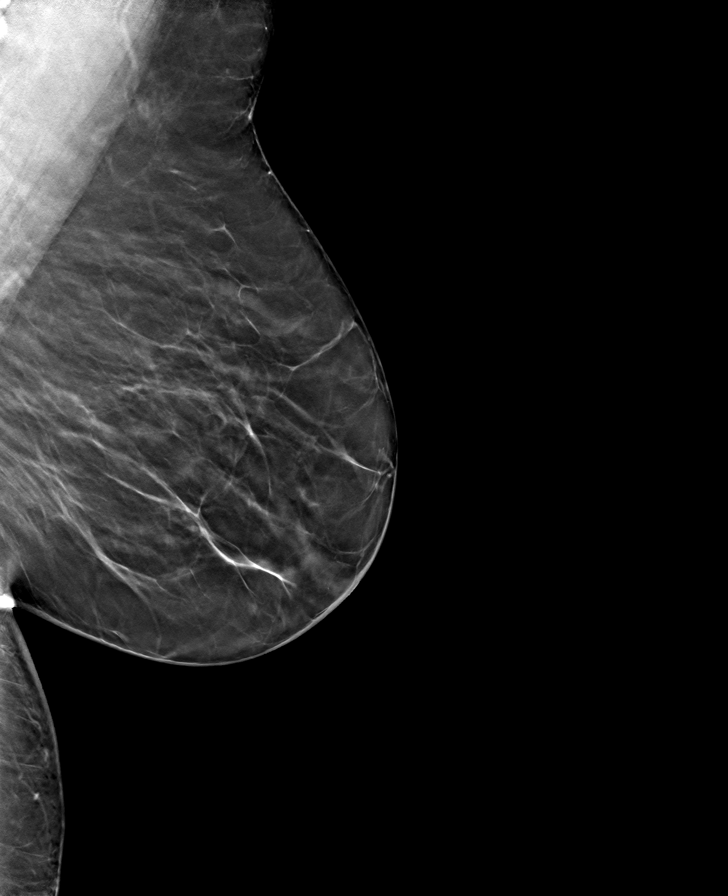

[R CC tomo · tomo slice 29/58.0]
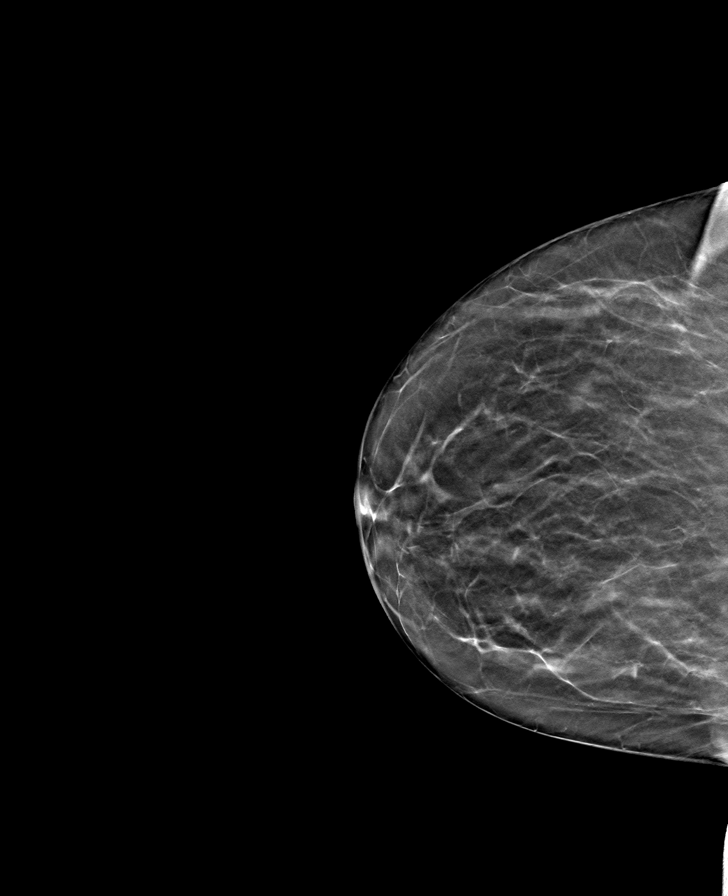

[R MLO tomo · tomo slice 34/67.0]
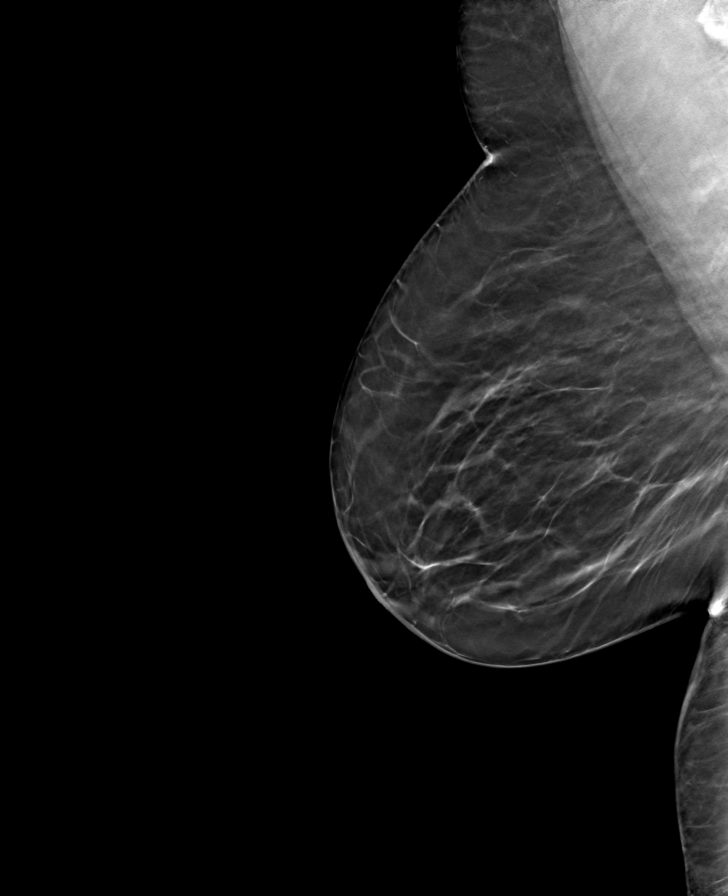

[8 of 24 positions shown; findings below may reference images not displayed]

ACR Breast Density Category b: There are scattered areas of
fibroglandular density.
FINDINGS: There are no findings suspicious for malignancy.
IMPRESSION: No mammographic evidence of malignancy. A result letter of this
screening mammogram will be mailed directly to the patient.

RECOMMENDATION:
Screening mammogram in one year. (Code:51-O-LD2)

BI-RADS CATEGORY  1: Negative.

## 2023-10-31 ENCOUNTER — Other Ambulatory Visit: Payer: Self-pay | Admitting: Family Medicine

## 2023-10-31 DIAGNOSIS — Z1231 Encounter for screening mammogram for malignant neoplasm of breast: Secondary | ICD-10-CM

## 2023-11-16 ENCOUNTER — Ambulatory Visit

## 2024-01-05 ENCOUNTER — Inpatient Hospital Stay: Admission: RE | Admit: 2024-01-05 | Discharge: 2024-01-05 | Attending: Family Medicine

## 2024-01-05 DIAGNOSIS — Z1231 Encounter for screening mammogram for malignant neoplasm of breast: Secondary | ICD-10-CM

## 2024-02-09 ENCOUNTER — Telehealth: Payer: Self-pay | Admitting: *Deleted

## 2024-02-09 ENCOUNTER — Encounter: Payer: Self-pay | Admitting: Podiatry

## 2024-02-09 ENCOUNTER — Ambulatory Visit

## 2024-02-09 ENCOUNTER — Ambulatory Visit (INDEPENDENT_AMBULATORY_CARE_PROVIDER_SITE_OTHER): Admitting: Podiatry

## 2024-02-09 DIAGNOSIS — M722 Plantar fascial fibromatosis: Secondary | ICD-10-CM

## 2024-02-09 DIAGNOSIS — M67471 Ganglion, right ankle and foot: Secondary | ICD-10-CM | POA: Diagnosis not present

## 2024-02-09 DIAGNOSIS — M79671 Pain in right foot: Secondary | ICD-10-CM | POA: Diagnosis not present

## 2024-02-09 NOTE — Progress Notes (Signed)
"  °  Subjective:  Patient ID: Dorothy Davis, female    DOB: 11-Sep-1955,   MRN: [id number]  Chief Complaint  Patient presents with   Foot Pain    I have this little bulge on the bottom of my right foot.  Wednesday, it woke me up out of my sleep.  It was paining.  I elevated it and I haven't had the pain since then.    69 y.o. female presents for concern as above.  Relates the spot on the bottom of her foot has been present for quite some time over the years that is flared up recently this past Wednesday when it flared up it was the worst it has ever been.  She relates elevating did help and now not having any pain with it currently but concerned it will act up again.  Denies any other pedal complaints. Denies n/v/f/c.   History reviewed. No pertinent past medical history.  Objective:  Physical Exam: Vascular: DP/PT pulses 2/4 bilateral. CFT <3 seconds. Normal hair growth on digits. No edema.  Skin. No lacerations or abrasions bilateral feet.  Small fluctuance about 1 cm mass noted to plantar right foot just distal to calcaneus with mild tenderness to palpation.  Nonmobile with the plantar fascia Musculoskeletal: MMT 5/5 bilateral lower extremities in DF, PF, Inversion and Eversion. Deceased ROM in DF of ankle joint.  Neurological: Sensation intact to light touch.   Assessment:   1. Ganglion cyst of right foot      Plan:  Patient was evaluated and treated and all questions answered. X-rays reviewed and discussed with patient. No acute fractures or dislocations noted.  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 Discussed ganglion cysts and treatment options with the patient. Discussed option for aspiration of the cyst and potential need for surgical intervention in the future.  Patient defers at this time. Padding provided to offload the area for the time being.  Advised on continuing to elevate. Discussed if any worsening pain or increase in size of the lesion would suggest further  intervention. Patient to follow-up as needed        Asberry Failing, DPM    "

## 2024-02-09 NOTE — Telephone Encounter (Signed)
 I attempted to call the patient.  I left her a voicemail asking her to arrive a few minutes early and stop by imaging for xrays of her right foot.  I asked her to call if she has any questions.  I advised her to enter the building via the back entrance.
